# Patient Record
Sex: Male | Born: 2000
Health system: Southern US, Community
[De-identification: ages and names within clinical notes are randomized; demographics above are authoritative.]

---
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
## 2018-01-23 ENCOUNTER — Emergency Department (HOSPITAL_COMMUNITY): Payer: Managed Care, Other (non HMO)

## 2018-01-23 ENCOUNTER — Other Ambulatory Visit: Payer: Self-pay

## 2018-01-23 ENCOUNTER — Encounter (HOSPITAL_COMMUNITY): Payer: Self-pay | Admitting: Emergency Medicine

## 2018-01-23 ENCOUNTER — Emergency Department (HOSPITAL_COMMUNITY)
Admission: EM | Admit: 2018-01-23 | Discharge: 2018-01-24 | Disposition: A | Discharge status: Home / Self Care | Payer: Managed Care, Other (non HMO) | Attending: Emergency Medicine | Admitting: Emergency Medicine

## 2018-01-23 DIAGNOSIS — Z5321 Procedure and treatment not carried out due to patient leaving prior to being seen by health care provider: Secondary | ICD-10-CM | POA: Insufficient documentation

## 2018-01-23 DIAGNOSIS — R079 Chest pain, unspecified: Secondary | ICD-10-CM | POA: Diagnosis not present

## 2018-01-23 DIAGNOSIS — R0602 Shortness of breath: Secondary | ICD-10-CM | POA: Diagnosis not present

## 2018-01-23 LAB — I-STAT TROPONIN, ED: Troponin i, poc: 0 ng/mL (ref 0.00–0.08)

## 2018-01-23 NOTE — ED Triage Notes (Signed)
Pt c/o chest tightness and shortness of breath that started tonight. Denies other associated symptoms, denies hx of asthma.

## 2018-01-24 LAB — BASIC METABOLIC PANEL
Anion gap: 10 (ref 5–15)
BUN: 5 mg/dL — AB (ref 6–20)
CALCIUM: 9.4 mg/dL (ref 8.9–10.3)
CO2: 24 mmol/L (ref 22–32)
CREATININE: 0.87 mg/dL (ref 0.61–1.24)
Chloride: 104 mmol/L (ref 101–111)
GFR calc Af Amer: >60 mL/min (ref >60)
Glucose, Bld: 112 mg/dL — ABNORMAL HIGH (ref 65–99)
Potassium: 3.5 mmol/L (ref 3.5–5.1)
Sodium: 138 mmol/L (ref 135–145)

## 2018-01-24 LAB — CBC
HCT: 40.4 % (ref 39.0–52.0)
Hemoglobin: 13.8 g/dL (ref 13.0–17.0)
MCH: 27.7 pg (ref 26.0–34.0)
MCHC: 34.2 g/dL (ref 30.0–36.0)
MCV: 81 fL (ref 78.0–100.0)
PLATELETS: 309 10*3/uL (ref 150–400)
RBC: 4.99 MIL/uL (ref 4.22–5.81)
RDW: 13.2 % (ref 11.5–15.5)
WBC: 8.9 10*3/uL (ref 4.0–10.5)

## 2018-01-24 NOTE — ED Notes (Signed)
No answer when called for room 

## 2018-01-31 DIAGNOSIS — L02811 Cutaneous abscess of head [any part, except face]: Secondary | ICD-10-CM | POA: Diagnosis not present

## 2018-06-01 ENCOUNTER — Other Ambulatory Visit: Payer: Self-pay

## 2018-06-01 ENCOUNTER — Encounter (HOSPITAL_BASED_OUTPATIENT_CLINIC_OR_DEPARTMENT_OTHER): Payer: Self-pay | Admitting: *Deleted

## 2018-06-01 ENCOUNTER — Emergency Department (HOSPITAL_BASED_OUTPATIENT_CLINIC_OR_DEPARTMENT_OTHER)
Admission: EM | Admit: 2018-06-01 | Discharge: 2018-06-01 | Disposition: A | Discharge status: Home / Self Care | Payer: Managed Care, Other (non HMO) | Attending: Emergency Medicine | Admitting: Emergency Medicine

## 2018-06-01 DIAGNOSIS — Z79899 Other long term (current) drug therapy: Secondary | ICD-10-CM | POA: Insufficient documentation

## 2018-06-01 DIAGNOSIS — Z711 Person with feared health complaint in whom no diagnosis is made: Secondary | ICD-10-CM | POA: Diagnosis not present

## 2018-06-01 DIAGNOSIS — R369 Urethral discharge, unspecified: Secondary | ICD-10-CM | POA: Diagnosis not present

## 2018-06-01 DIAGNOSIS — Z202 Contact with and (suspected) exposure to infections with a predominantly sexual mode of transmission: Secondary | ICD-10-CM | POA: Diagnosis not present

## 2018-06-01 LAB — URINALYSIS, ROUTINE W REFLEX MICROSCOPIC
BILIRUBIN URINE: NEGATIVE
GLUCOSE, UA: NEGATIVE mg/dL
Hgb urine dipstick: NEGATIVE
KETONES UR: NEGATIVE mg/dL
LEUKOCYTES UA: NEGATIVE
Nitrite: NEGATIVE
PH: 6.5 (ref 5.0–8.0)
PROTEIN: NEGATIVE mg/dL
Specific Gravity, Urine: 1.025 (ref 1.005–1.030)

## 2018-06-01 MED ORDER — CEFTRIAXONE SODIUM 250 MG IJ SOLR
250.0000 mg | Freq: Once | INTRAMUSCULAR | Status: AC
  Administered 2018-06-01: 250 mg via INTRAMUSCULAR
  Filled 2018-06-01: qty 250

## 2018-06-01 MED ORDER — AZITHROMYCIN 250 MG PO TABS
1000.0000 mg | ORAL_TABLET | Freq: Once | ORAL | Status: AC
  Administered 2018-06-01: 1000 mg via ORAL
  Filled 2018-06-01: qty 4

## 2018-06-01 NOTE — ED Provider Notes (Signed)
[facility] EMERGENCY DEPARTMENT Provider Note   CSN: [id number] Arrival date & time: 06/01/18  1801     History   Chief Complaint Chief Complaint  Patient presents with  . Exposure to STD    HPI Jacob Patton is a 17 y.o. male who presents for evaluation of penile discharge that began this morning.  Patient reports that he has had a history of chlamydia 6 months ago and again 3 months ago.  He states that current symptoms feel similar to when he had previous chlamydia.  He states he has not had any dysuria, hematuria.  He states he is currently sexually active with one partner and states that they intermittently use condoms.  Patient denies any fevers, testicular pain or swelling.  The history is provided by the patient.    History reviewed. No pertinent past medical history.  There are no active problems to display for this patient.   History reviewed. No pertinent surgical history.      Home Medications    Prior to Admission medications   Medication Sig Start Date End Date Taking? Authorizing Provider  benzonatate (TESSALON) 100 MG capsule Take 1 capsule (100 mg total) by mouth every 8 (eight) hours. 06/13/17   Khatri, Hina, PA-C  fluticasone (FLONASE) 50 MCG/ACT nasal spray Place 1 spray into both nostrils daily. 06/13/17   Khatri, Hina, PA-C  guaiFENesin (ROBITUSSIN) 100 MG/5ML liquid Take 5-10 mLs (100-200 mg total) by mouth every 4 (four) hours as needed for cough. 06/13/17   Khatri, Hina, PA-C  ibuprofen (ADVIL,MOTRIN) 600 MG tablet Take 1 tablet (600 mg total) by mouth every 6 (six) hours as needed. 11/03/17   Elisha Ponder, PA-C    Family History No family history on file.  Social History Social History   Tobacco Use  . Smoking status: Never Smoker  . Smokeless tobacco: Never Used  Substance Use Topics  . Alcohol use: No  . Drug use: No     Allergies   Patient has no known allergies.   Review of Systems Review of Systems  Constitutional:  Negative for fever.  Genitourinary: Positive for discharge. Negative for dysuria, hematuria, penile swelling and scrotal swelling.  All other systems reviewed and are negative.    Physical Exam Updated Vital Signs BP 135/80   Pulse 82   Temp 98.4 F (36.9 C) (Oral)   Resp 16   Ht 5\' 9"  (1.753 m)   Wt 104.3 kg   SpO2 100%   BMI 33.97 kg/m   Physical Exam  Constitutional: He appears well-developed and well-nourished.  HENT:  Head: Normocephalic and atraumatic.  Eyes: Conjunctivae and EOM are normal. Right eye exhibits no discharge. Left eye exhibits no discharge. No scleral icterus.  Pulmonary/Chest: Effort normal.  Abdominal: Hernia confirmed negative in the right inguinal area and confirmed negative in the left inguinal area.  Genitourinary: Testes normal. Right testis shows no swelling and no tenderness. Left testis shows no swelling and no tenderness. Circumcised. No discharge found.  Genitourinary Comments: The exam was performed with a chaperone present. Normal male genitalia. No evidence of rash, ulcers or lesions.   Neurological: He is alert.  Skin: Skin is warm and dry.  Psychiatric: He has a normal mood and affect. His speech is normal and behavior is normal.  Nursing note and vitals reviewed.    ED Treatments / Results  Labs (all labs ordered are listed, but only abnormal results are displayed) Labs Reviewed  URINALYSIS, ROUTINE W REFLEX MICROSCOPIC  GC/CHLAMYDIA PROBE AMP ([place]) NOT AT [facility]    EKG None  Radiology No results found.  Procedures Procedures (including critical care time)  Medications Ordered in ED Medications  cefTRIAXone (ROCEPHIN) injection 250 mg (has no administration in time range)  azithromycin (ZITHROMAX) tablet 1,000 mg (has no administration in time range)     Initial Impression / Assessment and Plan / ED Course  I have reviewed the triage vital signs and the nursing notes.  Pertinent labs & imaging results that were  available during my care of the patient were reviewed by me and considered in my medical decision making (see chart for details).     17 y.o. F who presents for evaluation of penile discharge x 1 day. History of chlamydia and reports recent unprotected sex.  Patient is afebrile, non-toxic appearing, sitting comfortably on examination table. Vital signs reviewed and stable. GU exam shows no evidence of rash or lesions . STD cultures pending. Wet prep revealed . Discussed treatment options with including treatment today or waiting until cultures returned.  Patient wishes to have treatment today. Discussed importance of informing sexual partners and to abstain from sexual intercourse until her partner has completed treatment. Patient had ample opportunity for questions and discussion. All patient's questions were answered with full understanding. Strict return precautions discussed. Patient expresses understanding and agreement to plan.    Final Clinical Impressions(s) / ED Diagnoses   Final diagnoses:  Concern about STD in male without diagnosis    ED Discharge Orders    None       Rosana Hoes 06/01/18 2003    Little, Ambrose Finland, MD 06/02/18 1453

## 2018-06-01 NOTE — ED Triage Notes (Signed)
Penile discharge. He was treated for chlamydia 6 months ago. He was treated for Chlamydia 3 months ago.

## 2018-06-01 NOTE — Discharge Instructions (Signed)

## 2018-06-04 LAB — GC/CHLAMYDIA PROBE AMP (~~LOC~~) NOT AT ARMC
CHLAMYDIA, DNA PROBE: NEGATIVE
Neisseria Gonorrhea: NEGATIVE

## 2018-10-27 ENCOUNTER — Emergency Department (HOSPITAL_COMMUNITY): Payer: Federal, State, Local not specified - PPO

## 2018-10-27 ENCOUNTER — Emergency Department (HOSPITAL_COMMUNITY)
Admission: EM | Admit: 2018-10-27 | Discharge: 2018-10-28 | Disposition: A | Discharge status: Home / Self Care | Payer: Federal, State, Local not specified - PPO | Attending: Emergency Medicine | Admitting: Emergency Medicine

## 2018-10-27 ENCOUNTER — Encounter (HOSPITAL_COMMUNITY): Payer: Self-pay

## 2018-10-27 DIAGNOSIS — R51 Headache: Secondary | ICD-10-CM | POA: Diagnosis not present

## 2018-10-27 DIAGNOSIS — J111 Influenza due to unidentified influenza virus with other respiratory manifestations: Secondary | ICD-10-CM | POA: Insufficient documentation

## 2018-10-27 DIAGNOSIS — R69 Illness, unspecified: Secondary | ICD-10-CM

## 2018-10-27 DIAGNOSIS — M7918 Myalgia, other site: Secondary | ICD-10-CM | POA: Insufficient documentation

## 2018-10-27 DIAGNOSIS — R0981 Nasal congestion: Secondary | ICD-10-CM | POA: Insufficient documentation

## 2018-10-27 DIAGNOSIS — R509 Fever, unspecified: Secondary | ICD-10-CM | POA: Insufficient documentation

## 2018-10-27 DIAGNOSIS — R05 Cough: Secondary | ICD-10-CM

## 2018-10-27 DIAGNOSIS — R059 Cough, unspecified: Secondary | ICD-10-CM

## 2018-10-27 MED ORDER — ONDANSETRON 4 MG PO TBDP
4.0000 mg | ORAL_TABLET | Freq: Once | ORAL | Status: AC
  Administered 2018-10-28: 4 mg via ORAL
  Filled 2018-10-27: qty 1

## 2018-10-27 MED ORDER — ACETAMINOPHEN 500 MG PO TABS
1000.0000 mg | ORAL_TABLET | Freq: Once | ORAL | Status: AC
  Administered 2018-10-28: 1000 mg via ORAL
  Filled 2018-10-27: qty 2

## 2018-10-27 NOTE — ED Provider Notes (Signed)
[facility] Provider Note   CSN: [id number] Arrival date & time: 10/27/18  2206     History   Chief Complaint Chief Complaint  Patient presents with  . Cough    HPI Jacob Patton is a 18 y.o. male otherwise healthy male presenting to emergency department today with chief complaint of cough.  He states the cough has been present x2 months.  He describes the cough as nonproductive.  He has associated watery discharge from the eyes in the morning.  He has not taken anything for his cough.  Patient also reports over the last week he has generalized body aches, headache, continuing to cough, nausea.  Patient took Alka-Seltzer cold and flu prior to arrival with minimal relief.  He has a history of headaches and this feels similar to that.  He is describing the headache as located over his scalp without radiation.  Describes the pain as a throbbing.  The headache has gradually worsened and he denies sudden onset.  He rates the pain 7 out of 10 in severity.  Denies fever, neck pain, neck stiffness, visual changes, sick contacts.   History reviewed. No pertinent past medical history.  There are no active problems to display for this patient.   History reviewed. No pertinent surgical history.      Home Medications    Prior to Admission medications   Medication Sig Start Date End Date Taking? Authorizing Provider  benzonatate (TESSALON) 100 MG capsule Take 1 capsule (100 mg total) by mouth 3 (three) times daily as needed for cough. 10/28/18   Albrizze, Kaitlyn E, PA-C  fluticasone (FLONASE) 50 MCG/ACT nasal spray Place 1 spray into both nostrils daily. 06/13/17   Khatri, Hina, PA-C  guaiFENesin (ROBITUSSIN) 100 MG/5ML liquid Take 5-10 mLs (100-200 mg total) by mouth every 4 (four) hours as needed for cough. 06/13/17   Khatri, Hina, PA-C  ibuprofen (ADVIL,MOTRIN) 600 MG tablet Take 1 tablet (600 mg total) by mouth every 6 (six) hours as needed. 11/03/17    Elisha PonderMurray, Alyssa B, PA-C    Family History History reviewed. No pertinent family history.  Social History Social History   Tobacco Use  . Smoking status: Never Smoker  . Smokeless tobacco: Never Used  Substance Use Topics  . Alcohol use: No  . Drug use: No     Allergies   Patient has no known allergies.   Review of Systems Review of Systems  Constitutional: Negative for chills, fatigue and unexpected weight change.  HENT: Positive for congestion and sinus pressure.   Eyes: Negative for photophobia, pain, itching and visual disturbance.  Respiratory: Positive for cough. Negative for chest tightness, shortness of breath and wheezing.   Cardiovascular: Negative for chest pain and palpitations.  Gastrointestinal: Positive for nausea. Negative for abdominal pain, diarrhea and vomiting.  Genitourinary: Negative for difficulty urinating, flank pain and hematuria.  Musculoskeletal: Positive for myalgias. Negative for gait problem, neck pain and neck stiffness.  Skin: Negative for rash and wound.  Allergic/Immunologic: Negative for immunocompromised state.  Neurological: Positive for headaches. Negative for dizziness, weakness and light-headedness.     Physical Exam Updated Vital Signs BP (!) 130/59 (BP Location: Left Arm)   Pulse (!) 124   Temp 100.1 F (37.8 C) (Oral)   Resp 18   Ht 5\' 9"  (1.753 m)   Wt 104.3 kg   SpO2 100%   BMI 33.97 kg/m   Physical Exam Vitals signs and nursing note reviewed.  Constitutional:  Appearance: He is not ill-appearing or toxic-appearing.  HENT:     Head: Normocephalic and atraumatic.     Nose: Nose normal.     Mouth/Throat:     Mouth: Mucous membranes are moist.     Pharynx: Oropharynx is clear.  Eyes:     General: No scleral icterus.    Extraocular Movements: Extraocular movements intact.     Conjunctiva/sclera: Conjunctivae normal.     Pupils: Pupils are equal, round, and reactive to light.  Neck:     Musculoskeletal:  Normal range of motion and neck supple. No neck rigidity or muscular tenderness.  Cardiovascular:     Rate and Rhythm: Regular rhythm. Tachycardia present.     Pulses: Normal pulses.     Heart sounds: Normal heart sounds.  Pulmonary:     Effort: Pulmonary effort is normal. No respiratory distress.     Breath sounds: Normal breath sounds. No wheezing.  Abdominal:     General: There is no distension.     Palpations: Abdomen is soft.     Tenderness: There is no abdominal tenderness. There is no guarding or rebound.  Musculoskeletal: Normal range of motion.  Lymphadenopathy:     Cervical: No cervical adenopathy.  Skin:    General: Skin is warm and dry.     Capillary Refill: Capillary refill takes less than 2 seconds.     Findings: No lesion or rash.  Neurological:     Mental Status: He is alert. Mental status is at baseline.     Motor: No weakness.     Comments: Speech is clear and goal oriented, follows commands CN III-XII intact, no facial droop Normal strength in upper and lower extremities bilaterally including dorsiflexion and plantar flexion, strong and equal grip strength Sensation normal to light and sharp touch Moves extremities without ataxia, coordination intact Normal finger to nose and rapid alternating movements Normal gait and balance   Psychiatric:        Behavior: Behavior normal.      ED Treatments / Results  Labs (all labs ordered are listed, but only abnormal results are displayed) Labs Reviewed - No data to display  EKG None  Radiology Dg Chest 2 View  Result Date: 10/28/2018 CLINICAL DATA:  Cough, congestion, and fever for 2 days. EXAM: CHEST - 2 VIEW COMPARISON:  01/23/2018 FINDINGS: The heart size and mediastinal contours are within normal limits. Both lungs are clear. The visualized skeletal structures are unremarkable. IMPRESSION: No active cardiopulmonary disease. Electronically Signed   By: Burman Nieves M.D.   On: 10/28/2018 00:12     Procedures Procedures (including critical care time)  Medications Ordered in ED Medications  acetaminophen (TYLENOL) tablet 1,000 mg (1,000 mg Oral Given 10/28/18 0032)  ondansetron (ZOFRAN-ODT) disintegrating tablet 4 mg (4 mg Oral Given 10/28/18 0031)  ibuprofen (ADVIL,MOTRIN) tablet 600 mg (600 mg Oral Given 10/28/18 0145)     Initial Impression / Assessment and Plan / ED Course  I have reviewed the triage vital signs and the nursing notes.  Pertinent labs & imaging results that were available during my care of the patient were reviewed by me and considered in my medical decision making (see chart for details).    Pt was tachycardic and febrile in triage. DDX includes flu, pneumonia, URI.  Given patient has had cough x2 months will get chest x-ray. I viewed the xray and do not see any signs suggestive of infectious processes. Pt's headache treated with Tylenol.  Pt's symptoms are consistent  with flu however he t is outside the 48 hour window for Tamiflu, I do not think it is necessary to test for the flu at this time. Discussed symptomatic treatment with Tylenol and motrin, increasing fluid intake. Prescription sent to his pharmacy for Tessalon for the cough. Recommend PCP follow up.   On repeat exam pt's nausea improved. Tolerating PO fluids. He continues to be febrile and tachycardic, but is requesting to be discharged and denies recommendation to continue for observation. Recommend follow up with pcp in 2-5 days. Discussed strict ED return precautions. Pt verbalized understanding of and is in agreement with this plan.  Pt stable for discharge home at this time.  Vitals:   10/27/18 2212 10/28/18 0030 10/28/18 0121  BP: (!) 130/59 (!) 122/54 (!) 115/53  Pulse: (!) 124 99 (!) 105  Resp: 18 16 14   Temp: 100.1 F (37.8 C) (!) 101.8 F (38.8 C) (!) 101.8 F (38.8 C)  TempSrc: Oral Oral Oral  SpO2: 100% 100% 100%  Weight: 104.3 kg    Height: 5\' 9"  (1.753 m)      Final Clinical  Impressions(s) / ED Diagnoses   Final diagnoses:  Cough  Influenza-like illness    ED Discharge Orders         Ordered    benzonatate (TESSALON) 100 MG capsule  3 times daily PRN     10/28/18 0045           Sherene Sires, PA-C 10/28/18 1638    Sabas Sous, MD 10/29/18 [phone]

## 2018-10-27 NOTE — ED Triage Notes (Signed)
Onset several months NP dry cough, worse in the past several days.  Pt took Alka seltzer cold and flu PTA.

## 2018-10-28 MED ORDER — BENZONATATE 100 MG PO CAPS: 100.0000 mg | ORAL_CAPSULE | Freq: Three times a day (TID) | ORAL | 0 refills | Status: AC | PRN

## 2018-10-28 MED ORDER — IBUPROFEN 400 MG PO TABS
600.0000 mg | ORAL_TABLET | Freq: Once | ORAL | Status: AC
  Administered 2018-10-28: 600 mg via ORAL
  Filled 2018-10-28: qty 1

## 2018-10-28 NOTE — Discharge Instructions (Addendum)
You have been seen today for cough and fever. Please read and follow all provided instructions. Return to the emergency room for worsening condition or new concerning symptoms.    1. Medications: Take medications as prescribed.  Prescriptions for Tessalon sent to your pharmacy. This is for your cough and you can take it as prescribed. You can take OTC Zyrtec for allergy symptoms. Continue to alternate motrin and Tylenol for fever and pain control. Take as directed. Continue usual home medications. Please review all of the medicines and only take them if you do not have an allergy to them. 2. Treatment: rest, drink plenty of fluids 3. Follow Up: Please follow up with your primary doctor in 2-5 days for discussion of your diagnoses and further evaluation after today's visit;  If you do not have a primary care doctor use the resource guide provided to find one; Please obtain all of your results from medical records or have your doctors office obtain the results - share them with your doctor - you should be seen at your doctors office. Call today to arrange your follow up.     ?  It is also a possibility that you have an allergic reaction to any of the medicines that you have been prescribed - Everybody reacts differently to medications and while MOST people have no trouble with most medicines, you may have a reaction such as nausea, vomiting, rash, swelling, shortness of breath. If this is the case, please stop taking the medicine immediately and contact your physician.  ?  You should return to the ER if you develop severe or worsening symptoms.

## 2018-10-28 NOTE — ED Notes (Signed)
Pt given dose of ibuprofen for continued fever. Pt requesting d/c rather than continued observation despite continued fever. Reviewed d/c instructions with pt, who verbalized understanding and had no outstanding questions. Pt departed in NAD, refused use of wheelchair.

## 2018-11-21 ENCOUNTER — Encounter: Payer: Self-pay | Admitting: Family Medicine

## 2018-11-21 ENCOUNTER — Ambulatory Visit (INDEPENDENT_AMBULATORY_CARE_PROVIDER_SITE_OTHER): Payer: Federal, State, Local not specified - PPO | Admitting: Family Medicine

## 2018-11-21 VITALS — BP 120/68 | HR 64 | Temp 97.9°F | Resp 12 | Ht 68.82 in | Wt 225.0 lb

## 2018-11-21 DIAGNOSIS — L0292 Furuncle, unspecified: Secondary | ICD-10-CM | POA: Diagnosis not present

## 2018-11-21 DIAGNOSIS — L309 Dermatitis, unspecified: Secondary | ICD-10-CM | POA: Diagnosis not present

## 2018-11-21 DIAGNOSIS — Z0000 Encounter for general adult medical examination without abnormal findings: Secondary | ICD-10-CM

## 2018-11-21 MED ORDER — TRIAMCINOLONE ACETONIDE 0.5 % EX OINT: 1.0000 "application " | TOPICAL_OINTMENT | Freq: Two times a day (BID) | CUTANEOUS | 2 refills | Status: DC

## 2018-11-21 MED ORDER — CLINDAMYCIN PHOSPHATE 1 % EX GEL: Freq: Two times a day (BID) | CUTANEOUS | 0 refills | Status: AC

## 2018-11-21 MED ORDER — TRIAMCINOLONE ACETONIDE 0.5 % EX OINT: 1.0000 "application " | TOPICAL_OINTMENT | Freq: Two times a day (BID) | CUTANEOUS | 2 refills | Status: AC

## 2018-11-21 NOTE — Patient Instructions (Addendum)
A few things to remember from today's visit:   Routine general medical examination at a health care facility  Forunculosis - Plan: clindamycin (CLINDAGEL) 1 % gel  Hand dermatitis - Plan: triamcinolone ointment (KENALOG) 0.5 %   At least 150 minutes of moderate exercise per week, daily brisk walking for 15-30 min is a good exercise option. Healthy diet low in saturated (animal) fats and sweets and consisting of fresh fruits and vegetables, lean meats such as fish and white chicken and whole grains.   Preventive Care for Jacob Patton, Male The transition to life after high school as a young adult can be a stressful time with many changes. You may start seeing a primary care physician instead of a pediatrician. This is the time when your health care becomes your responsibility. Preventive care refers to lifestyle choices and visits with your health care provider that can promote health and wellness. What does preventive care include?  A yearly physical exam. This is also called an annual wellness visit.  Dental exams once or twice a year.  Routine eye exams. Ask your health care provider how often you should have your eyes checked.  Personal lifestyle choices, including: ? Daily care of your teeth and gums. ? Regular physical activity. ? Eating a healthy diet. ? Avoiding tobacco and drug use. ? Avoiding or limiting alcohol use. ? Practicing safe sex. What happens during an annual wellness visit? Preventive care starts with a yearly visit to your primary care physician. The services and screenings done by your health care provider during your annual wellness visit will depend on your overall health, lifestyle risk factors, and family history of disease. Counseling Your health care provider may ask you questions about:  Past medical problems and your family's medical history.  Medicines or supplements that you take.  Health insurance and access to health care.  Alcohol,  tobacco, and drug use, including use of any bodybuilding drugs (anabolic steroids).  Your safety at home, work, or school.  Access to firearms.  Emotional well-being and how you cope with stress.  Relationship well-being.  Diet, exercise, and sleep habits.  Your sexual health and activity. Screening You may have the following tests or measurements:  Height, weight, and BMI.  Blood pressure.  Lipid and cholesterol levels.  Tuberculosis skin test.  Skin exam.  Vision and hearing tests.  Genital exam to check for testicular cancer or hernias.  Screening test for hepatitis.  Screening tests for STDs (sexually transmitted diseases), if you are at risk. Vaccines Your health care provider may recommend certain vaccines, such as:  Influenza vaccine. This is recommended every year.  Tetanus, diphtheria, and acellular pertussis (Tdap, Td) vaccine. You may need a Td booster every 10 years.  Varicella vaccine. You may need this if you have not been vaccinated.  HPV vaccine. If you are 79 or younger, you may need three doses over 6 months.  Measles, mumps, and rubella (MMR) vaccine. You may need at least one dose of MMR. You may also need a second dose.  Pneumococcal 13-valent conjugate (PCV13) vaccine. You may need this if you have certain conditions and have not been vaccinated.  Pneumococcal polysaccharide (PPSV23) vaccine. You may need one or two doses if you smoke cigarettes or if you have certain conditions.  Meningococcal vaccine. One dose is recommended if you are age 56-21 years and a first-year college student living in a residence hall, or if you have one of several medical conditions. You may also need  additional booster doses.  Hepatitis A vaccine. You may need this if you have certain conditions or if you travel or work in places where you may be exposed to hepatitis A.  Hepatitis B vaccine. You may need this if you have certain conditions or if you travel or  work in places where you may be exposed to hepatitis B.  Haemophilus influenzae type b (Hib) vaccine. You may need this if you have certain risk factors. Talk to your health care provider about which screenings and vaccines you need and how often you need them. What steps can I take to develop healthy behaviors?      Have regular preventive health care visits with your primary care physician and dentist.  Eat a healthy diet.  Drink enough fluid to keep your urine clear or pale yellow.  Stay active. Exercise at least 30 minutes 5 or more days of the week.  Use alcohol responsibly.  Maintain a healthy weight.  Do not use any products that contain nicotine, such as cigarettes, chewing tobacco, and e-cigarettes. If you need help quitting, ask your health care provider.  Do not use drugs.  Practice safe sex. This includes using condoms to prevent STDs or an unwanted pregnancy.  Find healthy ways to manage stress. How can I protect myself from injury? Injuries from violence or accidents are the leading cause of death among young adults and can often be prevented. Take these steps to help protect yourself:  Always wear your seat belt while driving or riding in a vehicle.  Do not drive if you have been drinking alcohol. Do not ride with someone who has been drinking.  Do not drive when you are tired or distracted. Do not text while driving.  Wear a helmet and other protective equipment during sports activities.  If you have firearms in your house, make sure you follow all gun safety procedures.  Seek help if you have been bullied, physically abused, or sexually abused.  Avoid fighting.  Use the Internet responsibly to avoid dangers such as online bullying. What can I do to cope with stress? Young adults may face many new challenges that can be stressful, such as finding a job, going to college, moving away from home, managing money, being in a relationship, getting married, and  having children. To manage stress:  Avoid known stressful situations when you can.  Exercise regularly.  Find a stress-reducing activity that works best for you. Examples include meditation, yoga, listening to music, or reading.  Spend time in nature.  Keep a journal to write about your stress and how you respond.  Talk to your health care provider about stress. He or she may suggest counseling.  Spend time with supportive friends or family.  Do not cope with stress by: ? Drinking alcohol or using drugs. ? Smoking cigarettes. ? Eating. Where can I get more information? Learn more about preventive care and healthy habits from:  U.S. Preventive Services Task Force: StageSync.si  National Adolescent and Raymond: StrategicRoad.nl  American Academy of Pediatrics Bright Futures: https://brightfutures.MemberVerification.co.za  Society for Adolescent Health and Medicine: MoralBlog.co.za.aspx  PodExchange.nl: ToyLending.fr This information is not intended to replace advice given to you by your health care provider. Make sure you discuss any questions you have with your health care provider. Document Released: 01/28/2016 Document Revised: 04/25/2017 Document Reviewed: 01/28/2016 Elsevier Interactive Patient Education  2019 [employer].

## 2018-11-21 NOTE — Progress Notes (Signed)
HPI:   Jacob Patton is a 18 y.o. male, who is here today to establish care.  Former PCP: N/A Last preventive routine visit: Before completing hight school.  Chronic medical problems: Otherwise healthy,except for tobacco use.   Concerns today: None. He agrees with having CPE today.   He lives with his maternal grandparents. He states home at this time,planning on going back to school (major in administration). Light smoker,he has not tried to quit.  He does not exercise regularly, lately he is trying to eat healthier.  He thinks he is up to date with all his vaccines. Denies sexual activity. No Hx of STD's.  Mentions years of intermittent scaly/dray palm, right hand. Denies pruritus or erythema. He has not tried OTC medications.  Also recurrent "boils" on different areas of his body. He has not identified exacerbating or alleviating factors. It happens one at the time.   Review of Systems  Constitutional: Negative for activity change, appetite change, fatigue, fever and unexpected weight change.  HENT: Negative for dental problem, nosebleeds, sore throat, trouble swallowing and voice change.   Eyes: Negative for redness and visual disturbance.  Respiratory: Negative for cough, shortness of breath and wheezing.   Cardiovascular: Negative for chest pain, palpitations and leg swelling.  Gastrointestinal: Negative for abdominal pain, blood in stool, nausea and vomiting.  Endocrine: Negative for polydipsia and polyuria.  Genitourinary: Negative for decreased urine volume, dysuria, genital sores, hematuria and testicular pain.  Musculoskeletal: Negative for arthralgias, joint swelling and myalgias.  Skin: Negative for color change and rash.  Neurological: Negative for dizziness, syncope, weakness and headaches.  Hematological: Negative for adenopathy. Does not bruise/bleed easily.  Psychiatric/Behavioral: Negative for confusion and sleep disturbance. The  patient is not nervous/anxious.   All other systems reviewed and are negative.   Current Outpatient Medications on File Prior to Visit  Medication Sig Dispense Refill  . benzonatate (TESSALON) 100 MG capsule Take 1 capsule (100 mg total) by mouth 3 (three) times daily as needed for cough. 21 capsule 0  . fluticasone (FLONASE) 50 MCG/ACT nasal spray Place 1 spray into both nostrils daily. 16 g 2  . guaiFENesin (ROBITUSSIN) 100 MG/5ML liquid Take 5-10 mLs (100-200 mg total) by mouth every 4 (four) hours as needed for cough. 60 mL 0  . ibuprofen (ADVIL,MOTRIN) 600 MG tablet Take 1 tablet (600 mg total) by mouth every 6 (six) hours as needed. 30 tablet 0   No current facility-administered medications on file prior to visit.      History reviewed. No pertinent past medical history. No Known Allergies  Family History  Problem Relation Age of Onset  . Cancer Maternal Grandmother   . Hyperlipidemia Paternal Grandmother     Social History   Socioeconomic History  . Marital status: Single    Spouse name: Not on file  . Number of children: Not on file  . Years of education: Not on file  . Highest education level: Not on file  Occupational History  . Not on file  Social Needs  . Financial resource strain: Not on file  . Food insecurity:    Worry: Not on file    Inability: Not on file  . Transportation needs:    Medical: Not on file    Non-medical: Not on file  Tobacco Use  . Smoking status: Never Smoker  . Smokeless tobacco: Never Used  Substance and Sexual Activity  . Alcohol use: Never    Frequency: Never  .  Drug use: Yes    Types: Marijuana  . Sexual activity: Not Currently  Lifestyle  . Physical activity:    Days per week: Not on file    Minutes per session: Not on file  . Stress: Not on file  Relationships  . Social connections:    Talks on phone: Not on file    Gets together: Not on file    Attends religious service: Not on file    Active member of club or  organization: Not on file    Attends meetings of clubs or organizations: Not on file    Relationship status: Not on file  Other Topics Concern  . Not on file  Social History Narrative   ** Merged History Encounter **        Vitals:   11/21/18 1022  BP: 120/68  Pulse: 64  Resp: 12  Temp: 97.9 F (36.6 C)  SpO2: 98%   Body mass index is 33.4 kg/m.  Physical Exam  Nursing note and vitals reviewed. Constitutional: He is oriented to person, place, and time. He appears well-developed. No distress.  HENT:  Head: Normocephalic and atraumatic.  Right Ear: Tympanic membrane, external ear and ear canal normal.  Left Ear: Tympanic membrane, external ear and ear canal normal.  Mouth/Throat: Oropharynx is clear and moist and mucous membranes are normal.  Eyes: Pupils are equal, round, and reactive to light. Conjunctivae and EOM are normal.  Neck: Normal range of motion. No tracheal deviation present. No thyromegaly present.  Cardiovascular: Normal rate and regular rhythm.  No murmur heard. Pulses:      Dorsalis pedis pulses are 2+ on the right side and 2+ on the left side.  Respiratory: Effort normal and breath sounds normal. No respiratory distress.  GI: Soft. He exhibits no mass. There is no hepatomegaly. There is no abdominal tenderness.  Genitourinary:    Genitourinary Comments: Refused,no concerns.   Musculoskeletal:        General: No tenderness or edema.     Comments: No major deformities appreciated and no signs of synovitis.  Lymphadenopathy:    He has no cervical adenopathy.       Right: No supraclavicular adenopathy present.       Left: No supraclavicular adenopathy present.  Neurological: He is alert and oriented to person, place, and time. He has normal strength. No cranial nerve deficit or sensory deficit. Coordination and gait normal.  Reflex Scores:      Bicep reflexes are 2+ on the right side and 2+ on the left side.      Patellar reflexes are 2+ on the right side  and 2+ on the left side. Skin: Skin is warm. No rash noted. No erythema.  Fine scaly palm,right hand.  Psychiatric: He has a normal mood and affect. Cognition and memory are normal.  Well groomed,good eye contact.    ASSESSMENT AND PLAN:  Jacob Patton was seen today for establish care and annual exam.  Diagnoses and all orders for this visit:  Routine general medical examination at a health care facility We discussed the importance of regular physical activity and healthy diet for prevention of chronic illness and/or complications. Preventive guidelines reviewed. Vaccination: Reported as up to date. He refused flu vaccine. STD prevention education provided. Next CPE in a year.  Forunculosis Topical abx when lesion first noted. Keep skin clean with soap and water. Instructed about warning signs.  -     clindamycin (CLINDAGEL) 1 % gel; Apply topically 2 (two) times  daily.  Hand dermatitis ? Dry skin,dermatitis. Recommend trying topical steroid oint bid for 2 weeks. We can consider dermatology referral,he will let me know.  -     triamcinolone ointment (KENALOG) 0.5 %; Apply 1 application topically 2 (two) times daily. On right palm    Return in 1 year (on 11/22/2019) for cpe.    Findley Vi G. [place], MD  [facility]. [place] office.

## 2018-11-22 ENCOUNTER — Encounter (HOSPITAL_COMMUNITY): Payer: Self-pay

## 2018-11-25 ENCOUNTER — Encounter: Payer: Self-pay | Admitting: Family Medicine

## 2019-01-24 ENCOUNTER — Ambulatory Visit (INDEPENDENT_AMBULATORY_CARE_PROVIDER_SITE_OTHER): Payer: Federal, State, Local not specified - PPO | Admitting: Adult Health

## 2019-01-24 ENCOUNTER — Other Ambulatory Visit: Payer: Self-pay

## 2019-01-24 ENCOUNTER — Encounter: Payer: Self-pay | Admitting: Adult Health

## 2019-01-24 DIAGNOSIS — L748 Other eccrine sweat disorders: Secondary | ICD-10-CM | POA: Diagnosis not present

## 2019-01-24 MED ORDER — DOXYCYCLINE HYCLATE 100 MG PO CAPS: 100.0000 mg | ORAL_CAPSULE | Freq: Two times a day (BID) | ORAL | 0 refills | Status: AC

## 2019-01-24 NOTE — Progress Notes (Signed)
Virtual Visit via Video Note  I connected with Jacob Patton  on 01/24/19 at  3:30 PM EDT by a video enabled telemedicine application and verified that I am speaking with the correct person using two identifiers.  Location patient: home Location provider:work or home office Persons participating in the virtual visit: patient, provider  I discussed the limitations of evaluation and management by telemedicine and the availability of in person appointments. The patient expressed understanding and agreed to proceed.   HPI:  18 year old male who is being evaluated today for acute on chronic issue of abscess on his right upper abdomen and left flank.  He reports a history of recurrent abscesses over the last 3 years.  He reports that "I will get prescribed an antibiotic and takes care of the abscess but then they eventually come back".  Current abscess is been present for approximately 2 weeks, painful with bending and twisting as well as palpation does report some redness and warmth.  No current drainage.  ROS: See pertinent positives and negatives per HPI.  No past medical history on file.  No past surgical history on file.  Family History  Problem Relation Age of Onset  . Cancer Maternal Grandmother   . Hyperlipidemia Paternal Grandmother       Current Outpatient Medications:  .  benzonatate (TESSALON) 100 MG capsule, Take 1 capsule (100 mg total) by mouth 3 (three) times daily as needed for cough., Disp: 21 capsule, Rfl: 0 .  clindamycin (CLINDAGEL) 1 % gel, Apply topically 2 (two) times daily., Disp: 30 g, Rfl: 0 .  doxycycline (VIBRAMYCIN) 100 MG capsule, Take 1 capsule (100 mg total) by mouth 2 (two) times daily for 14 days., Disp: 28 capsule, Rfl: 0 .  fluticasone (FLONASE) 50 MCG/ACT nasal spray, Place 1 spray into both nostrils daily., Disp: 16 g, Rfl: 2 .  guaiFENesin (ROBITUSSIN) 100 MG/5ML liquid, Take 5-10 mLs (100-200 mg total) by mouth every 4 (four) hours as needed for  cough., Disp: 60 mL, Rfl: 0 .  ibuprofen (ADVIL,MOTRIN) 600 MG tablet, Take 1 tablet (600 mg total) by mouth every 6 (six) hours as needed., Disp: 30 tablet, Rfl: 0 .  triamcinolone ointment (KENALOG) 0.5 %, Apply 1 application topically 2 (two) times daily. On right palm, Disp: 30 g, Rfl: 2  EXAM:  VITALS per patient if applicable:  GENERAL: alert, oriented, appears well and in no acute distress  HEENT: atraumatic, conjunttiva clear, no obvious abnormalities on inspection of external nose and ears  NECK: normal movements of the head and neck  LUNGS: on inspection no signs of respiratory distress, breathing rate appears normal, no obvious gross SOB, gasping or wheezing  CV: no obvious cyanosis  MS: moves all visible extremities without noticeable abnormality  PSYCH/NEURO: pleasant and cooperative, no obvious depression or anxiety, speech and thought processing grossly intact  SKIN: Large nonfluctuant appearing abscess on right upper abdomen as well as left flank.  Active drainage noted.  ASSESSMENT AND PLAN:    Discussed the following assessment and plan:  Prescribe doxycycline 100 mg twice daily x2 weeks.  Due to recurrent nature will send to dermatology.  Multiple sweat gland abscess - Plan: doxycycline (VIBRAMYCIN) 100 MG capsule, Ambulatory referral to Dermatology     I discussed the assessment and treatment plan with the patient. The patient was provided an opportunity to ask questions and all were answered. The patient agreed with the plan and demonstrated an understanding of the instructions.   The patient was advised  to call back or seek an in-person evaluation if the symptoms worsen or if the condition fails to improve as anticipated.   Dorothyann Peng, NP

## 2019-03-11 DIAGNOSIS — L738 Other specified follicular disorders: Secondary | ICD-10-CM | POA: Diagnosis not present

## 2019-09-04 ENCOUNTER — Other Ambulatory Visit: Payer: Self-pay

## 2019-09-04 DIAGNOSIS — Z20822 Contact with and (suspected) exposure to covid-19: Secondary | ICD-10-CM

## 2019-09-05 LAB — NOVEL CORONAVIRUS, NAA: SARS-CoV-2, NAA: NOT DETECTED

## 2019-09-06 ENCOUNTER — Telehealth: Payer: Self-pay | Admitting: Family Medicine

## 2019-09-06 NOTE — Telephone Encounter (Signed)
Patient is calling to receive his negative COVID test results. Patient expressed understanding. 

## 2019-11-19 DIAGNOSIS — B356 Tinea cruris: Secondary | ICD-10-CM | POA: Diagnosis not present

## 2019-11-19 DIAGNOSIS — N342 Other urethritis: Secondary | ICD-10-CM | POA: Diagnosis not present

## 2019-11-19 DIAGNOSIS — Z7251 High risk heterosexual behavior: Secondary | ICD-10-CM | POA: Diagnosis not present

## 2023-06-26 DIAGNOSIS — J988 Other specified respiratory disorders: Secondary | ICD-10-CM | POA: Diagnosis not present

## 2023-06-26 DIAGNOSIS — B9789 Other viral agents as the cause of diseases classified elsewhere: Secondary | ICD-10-CM | POA: Diagnosis not present

## 2023-08-04 DIAGNOSIS — J069 Acute upper respiratory infection, unspecified: Secondary | ICD-10-CM | POA: Diagnosis not present

## 2023-08-04 DIAGNOSIS — G43009 Migraine without aura, not intractable, without status migrainosus: Secondary | ICD-10-CM | POA: Diagnosis not present

## 2023-08-04 DIAGNOSIS — R110 Nausea: Secondary | ICD-10-CM | POA: Diagnosis not present

## 2023-12-28 DIAGNOSIS — R1012 Left upper quadrant pain: Secondary | ICD-10-CM | POA: Diagnosis not present

## 2023-12-28 DIAGNOSIS — R1902 Left upper quadrant abdominal swelling, mass and lump: Secondary | ICD-10-CM | POA: Diagnosis not present

## 2023-12-28 DIAGNOSIS — R109 Unspecified abdominal pain: Secondary | ICD-10-CM | POA: Diagnosis not present

## 2023-12-28 DIAGNOSIS — Z87891 Personal history of nicotine dependence: Secondary | ICD-10-CM | POA: Diagnosis not present

## 2023-12-28 DIAGNOSIS — Z5181 Encounter for therapeutic drug level monitoring: Secondary | ICD-10-CM | POA: Diagnosis not present

## 2023-12-29 DIAGNOSIS — I498 Other specified cardiac arrhythmias: Secondary | ICD-10-CM | POA: Diagnosis not present

## 2023-12-29 DIAGNOSIS — R0789 Other chest pain: Secondary | ICD-10-CM | POA: Diagnosis not present

## 2023-12-29 DIAGNOSIS — R202 Paresthesia of skin: Secondary | ICD-10-CM | POA: Diagnosis not present

## 2023-12-29 DIAGNOSIS — Z87891 Personal history of nicotine dependence: Secondary | ICD-10-CM | POA: Diagnosis not present

## 2023-12-29 DIAGNOSIS — R9431 Abnormal electrocardiogram [ECG] [EKG]: Secondary | ICD-10-CM | POA: Diagnosis not present

## 2023-12-29 DIAGNOSIS — F1290 Cannabis use, unspecified, uncomplicated: Secondary | ICD-10-CM | POA: Diagnosis not present

## 2023-12-29 DIAGNOSIS — R42 Dizziness and giddiness: Secondary | ICD-10-CM | POA: Diagnosis not present

## 2024-01-02 DIAGNOSIS — G4489 Other headache syndrome: Secondary | ICD-10-CM | POA: Diagnosis not present

## 2024-01-02 DIAGNOSIS — Z20822 Contact with and (suspected) exposure to covid-19: Secondary | ICD-10-CM | POA: Diagnosis not present

## 2024-01-02 DIAGNOSIS — R202 Paresthesia of skin: Secondary | ICD-10-CM | POA: Diagnosis not present

## 2024-01-02 DIAGNOSIS — Z87891 Personal history of nicotine dependence: Secondary | ICD-10-CM | POA: Diagnosis not present

## 2024-01-02 DIAGNOSIS — E871 Hypo-osmolality and hyponatremia: Secondary | ICD-10-CM | POA: Diagnosis not present

## 2024-01-02 DIAGNOSIS — E860 Dehydration: Secondary | ICD-10-CM | POA: Diagnosis not present

## 2024-01-02 DIAGNOSIS — R000 Tachycardia, unspecified: Secondary | ICD-10-CM | POA: Diagnosis not present

## 2024-01-02 DIAGNOSIS — E876 Hypokalemia: Secondary | ICD-10-CM | POA: Diagnosis not present

## 2024-01-02 DIAGNOSIS — R112 Nausea with vomiting, unspecified: Secondary | ICD-10-CM | POA: Diagnosis not present

## 2024-01-02 DIAGNOSIS — R42 Dizziness and giddiness: Secondary | ICD-10-CM | POA: Diagnosis not present

## 2024-10-26 ENCOUNTER — Emergency Department
Admission: EM | Admit: 2024-10-26 | Discharge: 2024-10-26 | Disposition: A | Discharge status: Home / Self Care | Payer: Self-pay

## 2024-10-26 ENCOUNTER — Other Ambulatory Visit: Payer: Self-pay

## 2024-10-26 DIAGNOSIS — R519 Headache, unspecified: Secondary | ICD-10-CM | POA: Insufficient documentation

## 2024-10-26 DIAGNOSIS — R739 Hyperglycemia, unspecified: Secondary | ICD-10-CM | POA: Insufficient documentation

## 2024-10-26 DIAGNOSIS — R509 Fever, unspecified: Secondary | ICD-10-CM | POA: Insufficient documentation

## 2024-10-26 LAB — URINALYSIS, ROUTINE W REFLEX MICROSCOPIC
Bacteria, UA: NONE SEEN
Bilirubin Urine: NEGATIVE
Glucose, UA: NEGATIVE mg/dL
Ketones, ur: 20 mg/dL — AB
Leukocytes,Ua: NEGATIVE
Nitrite: NEGATIVE
Protein, ur: 30 mg/dL — AB
Specific Gravity, Urine: 1.019 (ref 1.005–1.030)
pH: 6 (ref 5.0–8.0)

## 2024-10-26 LAB — CBC WITH DIFFERENTIAL/PLATELET
Abs Immature Granulocytes: 0.04 10*3/uL (ref 0.00–0.07)
Basophils Absolute: 0 10*3/uL (ref 0.0–0.1)
Basophils Relative: 0 %
Eosinophils Absolute: 0 10*3/uL (ref 0.0–0.5)
Eosinophils Relative: 0 %
HCT: 42 % (ref 39.0–52.0)
Hemoglobin: 13.7 g/dL (ref 13.0–17.0)
Immature Granulocytes: 0 %
Lymphocytes Relative: 21 %
Lymphs Abs: 1.9 10*3/uL (ref 0.7–4.0)
MCH: 27.2 pg (ref 26.0–34.0)
MCHC: 32.6 g/dL (ref 30.0–36.0)
MCV: 83.5 fL (ref 80.0–100.0)
Monocytes Absolute: 1.4 10*3/uL — ABNORMAL HIGH (ref 0.1–1.0)
Monocytes Relative: 15 %
Neutro Abs: 5.9 10*3/uL (ref 1.7–7.7)
Neutrophils Relative %: 64 %
Platelets: 170 10*3/uL (ref 150–400)
RBC: 5.03 MIL/uL (ref 4.22–5.81)
RDW: 12.8 % (ref 11.5–15.5)
WBC: 9.3 10*3/uL (ref 4.0–10.5)
nRBC: 0 % (ref 0.0–0.2)

## 2024-10-26 LAB — BASIC METABOLIC PANEL WITH GFR
Anion gap: 12 (ref 5–15)
BUN: <5 mg/dL — ABNORMAL LOW (ref 6–20)
CO2: 22 mmol/L (ref 22–32)
Calcium: 8.8 mg/dL — ABNORMAL LOW (ref 8.9–10.3)
Chloride: 99 mmol/L (ref 98–111)
Creatinine, Ser: 0.92 mg/dL (ref 0.61–1.24)
GFR, Estimated: >60 mL/min
Glucose, Bld: 111 mg/dL — ABNORMAL HIGH (ref 70–99)
Potassium: 3.4 mmol/L — ABNORMAL LOW (ref 3.5–5.1)
Sodium: 133 mmol/L — ABNORMAL LOW (ref 135–145)

## 2024-10-26 LAB — RESP PANEL BY RT-PCR (RSV, FLU A&B, COVID)  RVPGX2
Influenza A by PCR: NEGATIVE
Influenza B by PCR: NEGATIVE
Resp Syncytial Virus by PCR: NEGATIVE
SARS Coronavirus 2 by RT PCR: NEGATIVE

## 2024-10-26 LAB — CHLAMYDIA/NGC RT PCR (ARMC ONLY)
Chlamydia Tr: NOT DETECTED
N gonorrhoeae: NOT DETECTED

## 2024-10-26 LAB — GROUP A STREP BY PCR: Group A Strep by PCR: NOT DETECTED

## 2024-10-26 LAB — MONONUCLEOSIS SCREEN: Mono Screen: NEGATIVE

## 2024-10-26 MED ORDER — KETOROLAC TROMETHAMINE 15 MG/ML IJ SOLN
15.0000 mg | Freq: Once | INTRAMUSCULAR | Status: AC
  Administered 2024-10-26: 15 mg via INTRAVENOUS
  Filled 2024-10-26: qty 1

## 2024-10-26 MED ORDER — DIPHENHYDRAMINE HCL 50 MG/ML IJ SOLN
25.0000 mg | Freq: Once | INTRAMUSCULAR | Status: AC
  Administered 2024-10-26: 25 mg via INTRAVENOUS
  Filled 2024-10-26: qty 1

## 2024-10-26 MED ORDER — SODIUM CHLORIDE 0.9 % IV BOLUS
1000.0000 mL | Freq: Once | INTRAVENOUS | Status: AC
  Administered 2024-10-26: 1000 mL via INTRAVENOUS

## 2024-10-26 MED ORDER — ONDANSETRON HCL 4 MG/2ML IJ SOLN
4.0000 mg | Freq: Once | INTRAMUSCULAR | Status: AC
  Administered 2024-10-26: 4 mg via INTRAVENOUS
  Filled 2024-10-26: qty 2

## 2024-10-26 MED ORDER — DEXAMETHASONE SOD PHOSPHATE PF 10 MG/ML IJ SOLN
10.0000 mg | Freq: Once | INTRAMUSCULAR | Status: AC
  Administered 2024-10-26: 10 mg via INTRAVENOUS
  Filled 2024-10-26: qty 1

## 2024-10-26 MED ORDER — ACETAMINOPHEN 325 MG PO TABS
650.0000 mg | ORAL_TABLET | Freq: Once | ORAL | Status: AC
  Administered 2024-10-26: 650 mg via ORAL
  Filled 2024-10-26: qty 2

## 2024-10-26 NOTE — ED Triage Notes (Signed)
 Pt to ED for HA, body aches, chills, sore throat since 5 days. Also wants to get STI testing and states urinating more frequently. States would like to be tested for HSV but no symptoms.
